# Patient Record
Sex: Female | Born: 1945 | Race: White | Marital: Married | Smoking: Never smoker
Health system: Southern US, Community
[De-identification: ages and names within clinical notes are randomized; demographics above are authoritative.]

## PROBLEM LIST (undated history)

## (undated) DIAGNOSIS — K219 Gastro-esophageal reflux disease without esophagitis: Secondary | ICD-10-CM

## (undated) DIAGNOSIS — I10 Essential (primary) hypertension: Secondary | ICD-10-CM

## (undated) DIAGNOSIS — C50919 Malignant neoplasm of unspecified site of unspecified female breast: Secondary | ICD-10-CM

## (undated) DIAGNOSIS — F32A Depression, unspecified: Secondary | ICD-10-CM

## (undated) DIAGNOSIS — K449 Diaphragmatic hernia without obstruction or gangrene: Secondary | ICD-10-CM

## (undated) DIAGNOSIS — G4730 Sleep apnea, unspecified: Secondary | ICD-10-CM

## (undated) DIAGNOSIS — H539 Unspecified visual disturbance: Secondary | ICD-10-CM

## (undated) HISTORY — DX: Gastro-esophageal reflux disease without esophagitis: K21.9

## (undated) HISTORY — DX: Diaphragmatic hernia without obstruction or gangrene: K44.9

## (undated) HISTORY — DX: Unspecified visual disturbance: H53.9

## (undated) HISTORY — DX: Essential (primary) hypertension: I10

## (undated) HISTORY — DX: Malignant neoplasm of unspecified site of unspecified female breast: C50.919

## (undated) HISTORY — DX: Sleep apnea, unspecified: G47.30

## (undated) HISTORY — DX: Depression, unspecified: F32.A

---

## 2008-05-18 HISTORY — PX: MASTECTOMY: SHX3

## 2009-02-08 ENCOUNTER — Ambulatory Visit
Admission: RE | Admit: 2009-02-08 | Disposition: A | Discharge status: Home / Self Care | Payer: Self-pay | Source: Ambulatory Visit | Admitting: Surgery

## 2011-03-06 NOTE — Op Note (Unsigned)
Account Number: [id number]      Document ID: [id number]      Admit Date: 02/08/2009      Procedure Date: 02/08/2009            Patient Location: DISCHARGED 02/09/2009      Patient Type: A            SURGEON: Verneda Skill MD      ASSISTANT:                  PREOPERATIVE DIAGNOSES:      1.  Recurrent right DCIS.      2.  History of right lumpectomy followed by the radiation treatment for      DCIS in 2002.            POSTOPERATIVE DIAGNOSES:      1.  Recurrent right DCIS.      2.  History of right lumpectomy followed by the radiation treatment for      DCIS in 2002.            TITLE OF PROCEDURE:      1.  Attempted right sentinel lymph node biopsy with no yield.      2.  Right completion mastectomy.            ANESTHESIA:      General with LMA.            INDICATIONS:      This is a 65 year old female who previously underwent breast conservation      treatment for right DCIS in 2002, developed a recurrent DCIS on the      percutaneous biopsy.  Given the history of previous radiation, the      mastectomy was recommended.  Reconstructive option was also discussed with      the patient, however, patient declined at this time.  Given the medium      high-grade nature of the percutaneously biopsied, specimen, the sentinel      lymph node biopsy was also recommended and discussed and informed consent      was obtained prior to the procedure.            DESCRIPTION OF PROCEDURE:      The patient was placed on the operating room table in supine position and      after adequate general anesthesia was induced, 1 mL of isosulfan blue dye      was injected in subareolar complex on the right breast and lightly agitated      for approximately 5 minutes.  Then the right breast and anterior chest wall      was prepped and draped in usual sterile fashion.  Then, elliptical incision      to include the right nipple-areolar complex of the right breast was made      and subcutaneous tissue was divided and a flap was elevated.  This       elevation was carried out to the level just below the clavicle superiorly      and sternum medially and insertion rectus inferiorly and the latissimus      dorsi muscle laterally.  Of note, because of the previous radiation,      fibrotic changes were also noted.  After this was done, the breast tissue      was removed from the pectoralis fascia while the perforators were      cauterized for hemostasis.  The removed specimen was oriented with a suture  at the lateral aspect and sent to pathology.            Then, attention was sent to the right axilla where clavipectoral fascia was      incised and the right axillary fossa was explored.  However, there are no      blue-stained lymph node noted.  The level 1 and level 2 lymph nodes were      also explored; however, there were no suspicious or enlarged lymph nodes      noted.  Because of the radiation changes, this dissection was somewhat      technically difficult.  Given the pathology of DCIS, axillary lymph node      sampling or node dissection was not done.            Adequate hemostasis was ascertained and a 15 round Blake drain was brought      through a separate stab wound and placed under the mastectomy flap.  Then      the incision was closed with absorbable suture in multiple layers.  The      patient tolerated the procedure well and was transferred to the recovery      room in satisfactory condition.  Estimated blood loss was minimum and the      counts were correct at the end of the procedure.                              _______________________________     Date/Time Signed: _____________      Verneda Skill MD [phone])            D:  02/08/2009 17:42 PM by Dr. Verneda Skill, MD [phone])      T:  02/09/2009 15:08 PM by [id number]          Everlean Cherry: [id number]) (Doc ID: [id number])                  UX:[id number] Gendel MD      Verneda Skill MD

## 2013-04-06 NOTE — H&P (Addendum)
pnt seen and examined and agree with above  Plan tlh/bso poss lnd poss xlap

## 2013-04-08 ENCOUNTER — Telehealth: Payer: Medicare Other

## 2013-04-08 NOTE — Pre-Procedure Instructions (Signed)
Unable to reach pt or son for int, left a vm for them to call back [phone] & put the case on the db table.

## 2013-04-10 NOTE — Discharge Instructions (Signed)
[facility]   [address], [address]   [place], [place] [address]   Phone: [phone]   Fax: [phone]        Discharge Instructions for Gynecologic Surgery  You had gynecologic surgery. This sheet contains information about what you can and can't do after your surgery. Remember, you need to take it easy.    Activity   Limit your activity for4-6weeks.   Don't lift anything heavier than5-10pounds (nothing heavier than a full milk jug).   Avoid strenuous activities, such as mowing the lawn, vacuuming, lifting laundry basket, or playing sports.   Limit your activity to short, slow walks. Gradually increase your pace and distance as you feel able.   Listen to your body. If an activity causes pain, stop.   Don't drive for2weeks. You may ride in a car for short trips.   Rest when you are tired.   Don't have sexual intercourse or use tampons or douches until your doctor says it's safe to do so.   No soaking in bathtub or swimming until cleared by your doctor    Home Care   Always keep your incision clean and dry.   Shower as needed. Wash your incision gently with mild soap and warm water and pat dry. Do not put lotions or ointments on these areas.   Use pain medication as prescribed   Use tylenol as needed    Check your temperature every day for1week(s) after your surgery.   Return to your diet as you feel able. Eat a healthy, well-balanced diet.    It is normal to have vaginal bleeding after this surgery (like a light period or spotting) for up to two weeks. You may then have bleeding again at 6 weeks when the sutures dissolve. You can wear a panty liner during this period (unscented). As the sutures dissolve, it is normal to have a watery yellow discharge with a musty odor. It is NOT normal to soak a pad an hour with blood, or to have a very foul smelling discharge. If you experience this, you need to be evaluated immediately.    Do not drink alcohol while  taking narcotics.    You may put an ice pack on your perineal area three times a day (wrapped in a towel, not directly on skin) the first week after surgery to help alleviate pain and swelling.     Avoid constipation.   Use laxatives, stool softeners, or enemas as directed by your doctor. (recommend colace three times per day)   Eat more high-fiber foods.   Drink6-8 glasses of water every day, unless directed otherwise.    Follow-Up  Make a follow-up appointment as directed by our staff.    When to Call Your Doctor  Call your doctor right away if you have any of the following:   Fever above101.5For chills   Bright red vaginal bleeding or a smelly discharge   Vaginal bleeding that soaks more than onesanitary pad per hour   Trouble urinating or burning sensationwhen you urinate   Severe abdominal pain or bloating   Redness, swelling, or drainage at your incision site   Shortness of breath   Vomiting   Severe constipation      2000-2011 [employer], [address], [place] [address]. All rights reserved. This information is not intended as a substitute for professional medical care. Always follow your healthcare professional's instructions.

## 2013-04-11 ENCOUNTER — Ambulatory Visit: Payer: Medicare Other | Admitting: Gynecologic Oncology

## 2013-04-11 ENCOUNTER — Ambulatory Visit: Payer: Self-pay

## 2013-04-11 ENCOUNTER — Encounter (HOSPITAL_BASED_OUTPATIENT_CLINIC_OR_DEPARTMENT_OTHER): Payer: Self-pay | Admitting: Certified Registered Nurse Anesthetist

## 2013-04-11 ENCOUNTER — Encounter
Admission: RE | Disposition: A | Discharge status: Home / Self Care | Payer: Self-pay | Source: Ambulatory Visit | Attending: Gynecologic Oncology

## 2013-04-11 ENCOUNTER — Ambulatory Visit (HOSPITAL_BASED_OUTPATIENT_CLINIC_OR_DEPARTMENT_OTHER): Payer: Medicare Other | Admitting: Certified Registered Nurse Anesthetist

## 2013-04-11 ENCOUNTER — Encounter (HOSPITAL_BASED_OUTPATIENT_CLINIC_OR_DEPARTMENT_OTHER): Payer: Self-pay

## 2013-04-11 ENCOUNTER — Ambulatory Visit
Admission: RE | Admit: 2013-04-11 | Discharge: 2013-04-12 | Disposition: A | Discharge status: Home / Self Care | Payer: Medicare Other | Source: Ambulatory Visit | Attending: Gynecologic Oncology | Admitting: Gynecologic Oncology

## 2013-04-11 DIAGNOSIS — D250 Submucous leiomyoma of uterus: Secondary | ICD-10-CM | POA: Insufficient documentation

## 2013-04-11 DIAGNOSIS — Z853 Personal history of malignant neoplasm of breast: Secondary | ICD-10-CM | POA: Insufficient documentation

## 2013-04-11 DIAGNOSIS — K219 Gastro-esophageal reflux disease without esophagitis: Secondary | ICD-10-CM | POA: Insufficient documentation

## 2013-04-11 DIAGNOSIS — C549 Malignant neoplasm of corpus uteri, unspecified: Secondary | ICD-10-CM | POA: Insufficient documentation

## 2013-04-11 DIAGNOSIS — N841 Polyp of cervix uteri: Secondary | ICD-10-CM | POA: Insufficient documentation

## 2013-04-11 DIAGNOSIS — E785 Hyperlipidemia, unspecified: Secondary | ICD-10-CM | POA: Insufficient documentation

## 2013-04-11 DIAGNOSIS — F3289 Other specified depressive episodes: Secondary | ICD-10-CM | POA: Insufficient documentation

## 2013-04-11 DIAGNOSIS — I10 Essential (primary) hypertension: Secondary | ICD-10-CM | POA: Insufficient documentation

## 2013-04-11 DIAGNOSIS — G4730 Sleep apnea, unspecified: Secondary | ICD-10-CM | POA: Insufficient documentation

## 2013-04-11 DIAGNOSIS — Z9010 Acquired absence of unspecified breast and nipple: Secondary | ICD-10-CM | POA: Insufficient documentation

## 2013-04-11 DIAGNOSIS — K449 Diaphragmatic hernia without obstruction or gangrene: Secondary | ICD-10-CM | POA: Insufficient documentation

## 2013-04-11 HISTORY — PX: LAPAROSCOPIC, HYSTERECTOMY, TOTAL, BSO: SHX4523

## 2013-04-11 SURGERY — LAPAROSCOPIC, HYSTERECTOMY, TOTAL, BSO
Anesthesia: Anesthesia General | Site: Abdomen | Wound class: Clean Contaminated

## 2013-04-11 MED ORDER — GLYCOPYRROLATE 0.2 MG/ML IJ SOLN
INTRAMUSCULAR | Status: AC
Start: 2013-04-11 — End: ?
  Filled 2013-04-11: qty 3

## 2013-04-11 MED ORDER — LACTATED RINGERS IV SOLN
INTRAVENOUS | Status: DC
Start: 2013-04-11 — End: 2013-04-12

## 2013-04-11 MED ORDER — BUPIVACAINE HCL (PF) 0.25 % IJ SOLN
INTRAMUSCULAR | Status: AC
Start: 2013-04-11 — End: 2013-04-12
  Filled 2013-04-11: qty 10

## 2013-04-11 MED ORDER — PROPOFOL 10 MG/ML IV EMUL
INTRAVENOUS | Status: AC
Start: 2013-04-11 — End: ?
  Filled 2013-04-11: qty 20

## 2013-04-11 MED ORDER — HYDROMORPHONE HCL 2 MG PO TABS
2.0000 mg | ORAL_TABLET | ORAL | Status: DC | PRN
Start: 2013-04-11 — End: 2017-09-02

## 2013-04-11 MED ORDER — PROPOFOL INFUSION 10 MG/ML
INTRAVENOUS | Status: DC | PRN
Start: 2013-04-11 — End: 2013-04-11
  Administered 2013-04-11: 200 mg via INTRAVENOUS

## 2013-04-11 MED ORDER — DEXAMETHASONE SODIUM PHOSPHATE 20 MG/5ML IJ SOLN
INTRAMUSCULAR | Status: AC
Start: 2013-04-11 — End: ?
  Filled 2013-04-11: qty 5

## 2013-04-11 MED ORDER — LABETALOL HCL 5 MG/ML IV SOLN
INTRAVENOUS | Status: DC | PRN
Start: 2013-04-11 — End: 2013-04-11
  Administered 2013-04-11: 10 mg via INTRAVENOUS
  Administered 2013-04-11: 5 mg via INTRAVENOUS
  Administered 2013-04-11: 10 mg via INTRAVENOUS

## 2013-04-11 MED ORDER — HYDROMORPHONE HCL PF 1 MG/ML IJ SOLN
INTRAMUSCULAR | Status: AC
Start: 2013-04-11 — End: ?
  Filled 2013-04-11: qty 1

## 2013-04-11 MED ORDER — GLYCOPYRROLATE 0.2 MG/ML IJ SOLN
INTRAMUSCULAR | Status: DC | PRN
Start: 2013-04-11 — End: 2013-04-11
  Administered 2013-04-11: .5 mg via INTRAVENOUS

## 2013-04-11 MED ORDER — FENTANYL CITRATE 0.05 MG/ML IJ SOLN
INTRAMUSCULAR | Status: AC
Start: 2013-04-11 — End: ?
  Filled 2013-04-11: qty 2

## 2013-04-11 MED ORDER — BELLADONNA ALKALOIDS-OPIUM 16.2-60 MG RE SUPP
RECTAL | Status: AC
Start: 2013-04-11 — End: ?
  Filled 2013-04-11: qty 1

## 2013-04-11 MED ORDER — BUPIVACAINE-EPINEPHRINE (PF) 0.25% -1:200000 IJ SOLN
INTRAMUSCULAR | Status: DC | PRN
Start: 2013-04-11 — End: 2013-04-11
  Administered 2013-04-11: 10 mL via INTRAMUSCULAR

## 2013-04-11 MED ORDER — ENOXAPARIN SODIUM 40 MG/0.4ML SC SOLN
40.00 mg | Freq: Every day | SUBCUTANEOUS | Status: DC
Start: 2013-04-11 — End: 2013-04-12
  Administered 2013-04-12: 40 mg via SUBCUTANEOUS
  Filled 2013-04-11: qty 0.4

## 2013-04-11 MED ORDER — LIDOCAINE HCL (PF) 2 % IJ SOLN
INTRAMUSCULAR | Status: AC
Start: 2013-04-11 — End: ?
  Filled 2013-04-11: qty 5

## 2013-04-11 MED ORDER — LABETALOL HCL 5 MG/ML IV SOLN
INTRAVENOUS | Status: AC
Start: 2013-04-11 — End: ?
  Filled 2013-04-11: qty 20

## 2013-04-11 MED ORDER — HYDROMORPHONE HCL PF 1 MG/ML IJ SOLN
0.5000 mg | INTRAMUSCULAR | Status: DC | PRN
Start: 2013-04-11 — End: 2013-04-11
  Administered 2013-04-11: 0.5 mg via INTRAVENOUS

## 2013-04-11 MED ORDER — MEPERIDINE HCL 25 MG/ML IJ SOLN
12.5000 mg | INTRAMUSCULAR | Status: DC | PRN
Start: 2013-04-11 — End: 2013-04-11

## 2013-04-11 MED ORDER — ROCURONIUM BROMIDE 50 MG/5ML IV SOLN
INTRAVENOUS | Status: AC
Start: 2013-04-11 — End: ?
  Filled 2013-04-11: qty 5

## 2013-04-11 MED ORDER — FENTANYL CITRATE 0.05 MG/ML IJ SOLN
50.0000 ug | INTRAMUSCULAR | Status: AC | PRN
Start: 2013-04-11 — End: 2013-04-11
  Administered 2013-04-11 (×2): 50 ug via INTRAVENOUS

## 2013-04-11 MED ORDER — FAMOTIDINE 20 MG/2ML IV SOLN
INTRAVENOUS | Status: AC
Start: 2013-04-11 — End: ?
  Filled 2013-04-11: qty 2

## 2013-04-11 MED ORDER — ONDANSETRON HCL 4 MG/2ML IJ SOLN
4.0000 mg | Freq: Every day | INTRAMUSCULAR | Status: DC | PRN
Start: 2013-04-11 — End: 2013-04-12

## 2013-04-11 MED ORDER — MIDAZOLAM HCL 2 MG/2ML IJ SOLN
INTRAMUSCULAR | Status: DC | PRN
Start: 2013-04-11 — End: 2013-04-11
  Administered 2013-04-11 (×2): 1 mg via INTRAVENOUS

## 2013-04-11 MED ORDER — BELLADONNA ALKALOIDS-OPIUM 16.2-60 MG RE SUPP
60.0000 mg | Freq: Once | RECTAL | Status: AC
Start: 2013-04-11 — End: 2013-04-11
  Administered 2013-04-11: 60 mg via RECTAL

## 2013-04-11 MED ORDER — METHYLENE BLUE 1 % IJ SOLN
INTRAMUSCULAR | Status: DC | PRN
Start: 2013-04-11 — End: 2013-04-11
  Administered 2013-04-11: 10 mg via INTRAVENOUS

## 2013-04-11 MED ORDER — FAMOTIDINE 10 MG/ML IV SOLN (WRAP)
INTRAVENOUS | Status: DC | PRN
Start: 2013-04-11 — End: 2013-04-11
  Administered 2013-04-11: 20 mg via INTRAVENOUS

## 2013-04-11 MED ORDER — NICARDIPINE IV BOLUS SYRINGE (ANESTHESIA)
INTRAVENOUS | Status: AC
Start: 2013-04-11 — End: ?
  Filled 2013-04-11: qty 5

## 2013-04-11 MED ORDER — HYDROMORPHONE HCL 2 MG PO TABS
2.0000 mg | ORAL_TABLET | ORAL | Status: DC | PRN
Start: 2013-04-11 — End: 2013-04-12
  Administered 2013-04-12: 2 mg via ORAL
  Filled 2013-04-11: qty 1

## 2013-04-11 MED ORDER — LACTATED RINGERS IV SOLN
150.0000 mL/h | INTRAVENOUS | Status: DC
Start: 2013-04-11 — End: 2013-04-12
  Administered 2013-04-11: 150 mL/h via INTRAVENOUS

## 2013-04-11 MED ORDER — ACETAMINOPHEN 500 MG PO TABS
1000.0000 mg | ORAL_TABLET | Freq: Three times a day (TID) | ORAL | Status: DC
Start: 2013-04-11 — End: 2013-04-12
  Administered 2013-04-11 – 2013-04-12 (×2): 1000 mg via ORAL
  Filled 2013-04-11 (×2): qty 2

## 2013-04-11 MED ORDER — BUPIVACAINE-EPINEPHRINE (PF) 0.25% -1:200000 IJ SOLN
INTRAMUSCULAR | Status: AC
Start: 2013-04-11 — End: 2013-04-12
  Filled 2013-04-11: qty 20

## 2013-04-11 MED ORDER — LACTATED RINGERS IV SOLN
INTRAVENOUS | Status: DC | PRN
Start: 2013-04-11 — End: 2013-04-11

## 2013-04-11 MED ORDER — NICARDIPINE IV BOLUS SYRINGE (ANESTHESIA)
INTRAVENOUS | Status: DC | PRN
Start: 2013-04-11 — End: 2013-04-11
  Administered 2013-04-11: .2 mg via INTRAVENOUS

## 2013-04-11 MED ORDER — LIDOCAINE HCL 2 % IJ SOLN
INTRAMUSCULAR | Status: DC | PRN
Start: 2013-04-11 — End: 2013-04-11
  Administered 2013-04-11: 100 mg

## 2013-04-11 MED ORDER — FENTANYL CITRATE 0.05 MG/ML IJ SOLN
INTRAMUSCULAR | Status: DC | PRN
Start: 2013-04-11 — End: 2013-04-11
  Administered 2013-04-11 (×6): 50 ug via INTRAVENOUS

## 2013-04-11 MED ORDER — MIDAZOLAM HCL 2 MG/2ML IJ SOLN
INTRAMUSCULAR | Status: AC
Start: 2013-04-11 — End: ?
  Filled 2013-04-11: qty 2

## 2013-04-11 MED ORDER — SODIUM CHLORIDE 0.9 % IR SOLN
Status: DC | PRN
Start: 2013-04-11 — End: 2013-04-11
  Administered 2013-04-11: 200 mL

## 2013-04-11 MED ORDER — STERILE WATER FOR IRRIGATION IR SOLN
Status: DC | PRN
Start: 2013-04-11 — End: 2013-04-11
  Administered 2013-04-11: 200 mL

## 2013-04-11 MED ORDER — CEFAZOLIN SODIUM-DEXTROSE 2-3 GM-% IV SOLR
INTRAVENOUS | Status: AC
Start: 2013-04-11 — End: 2013-04-12
  Filled 2013-04-11: qty 50

## 2013-04-11 MED ORDER — GLYCOPYRROLATE 0.2 MG/ML IJ SOLN
INTRAMUSCULAR | Status: AC
Start: 2013-04-11 — End: ?
  Filled 2013-04-11: qty 2

## 2013-04-11 MED ORDER — ONDANSETRON HCL 4 MG/2ML IJ SOLN
INTRAMUSCULAR | Status: DC | PRN
Start: 2013-04-11 — End: 2013-04-11
  Administered 2013-04-11: 4 mg via INTRAVENOUS

## 2013-04-11 MED ORDER — HYDROCODONE-ACETAMINOPHEN 5-325 MG PO TABS
1.0000 | ORAL_TABLET | ORAL | Status: DC | PRN
Start: 2013-04-11 — End: 2013-04-11

## 2013-04-11 MED ORDER — NEOSTIGMINE METHYLSULFATE 1 MG/ML IJ SOLN
INTRAMUSCULAR | Status: AC
Start: 2013-04-11 — End: ?
  Filled 2013-04-11: qty 10

## 2013-04-11 MED ORDER — CEFAZOLIN SODIUM-DEXTROSE 2-3 GM-% IV SOLR
2.00 g | INTRAVENOUS | Status: DC
Start: 2013-04-11 — End: 2013-04-12
  Administered 2013-04-11: 2 g via INTRAVENOUS

## 2013-04-11 MED ORDER — DOCUSATE SODIUM 100 MG PO CAPS
100.0000 mg | ORAL_CAPSULE | Freq: Two times a day (BID) | ORAL | Status: AC
Start: 2013-04-11 — End: ?

## 2013-04-11 MED ORDER — PROMETHAZINE HCL 25 MG/ML IJ SOLN
6.2500 mg | Freq: Once | INTRAMUSCULAR | Status: DC | PRN
Start: 2013-04-11 — End: 2013-04-11

## 2013-04-11 MED ORDER — ENOXAPARIN SODIUM 40 MG/0.4ML SC SOLN
SUBCUTANEOUS | Status: AC
Start: 2013-04-11 — End: 2013-04-11
  Administered 2013-04-11: 40 mg via SUBCUTANEOUS
  Filled 2013-04-11: qty 0.4

## 2013-04-11 MED ORDER — ONDANSETRON HCL 4 MG/2ML IJ SOLN
INTRAMUSCULAR | Status: AC
Start: 2013-04-11 — End: ?
  Filled 2013-04-11: qty 2

## 2013-04-11 MED ORDER — NEOSTIGMINE METHYLSULFATE 1 MG/ML IJ SOLN
INTRAMUSCULAR | Status: DC | PRN
Start: 2013-04-11 — End: 2013-04-11
  Administered 2013-04-11: 3 mg via INTRAVENOUS

## 2013-04-11 MED ORDER — PROMETHAZINE HCL 25 MG/ML IJ SOLN
12.5000 mg | INTRAMUSCULAR | Status: DC | PRN
Start: 2013-04-11 — End: 2013-04-12

## 2013-04-11 MED ORDER — ROCURONIUM BROMIDE 50 MG/5ML IV SOLN
INTRAVENOUS | Status: DC | PRN
Start: 2013-04-11 — End: 2013-04-11
  Administered 2013-04-11: 40 mg via INTRAVENOUS

## 2013-04-11 MED ORDER — ONDANSETRON HCL 4 MG/2ML IJ SOLN
4.0000 mg | Freq: Once | INTRAMUSCULAR | Status: DC | PRN
Start: 2013-04-11 — End: 2013-04-11

## 2013-04-11 MED ORDER — DEXAMETHASONE SODIUM PHOSPHATE 4 MG/ML IJ SOLN (WRAP)
INTRAMUSCULAR | Status: DC | PRN
Start: 2013-04-11 — End: 2013-04-11
  Administered 2013-04-11: 8 mg via INTRAVENOUS

## 2013-04-11 SURGICAL SUPPLY — 67 items
APPLCATOR CHLORAPREP 26ML (Prep) ×2 IMPLANT
APPLICATOR ENDOSCOPIC L41 CM (Hemostat) ×1 IMPLANT
APPLICATOR ENDOSCOPIC L41 CM NONREFLECTIVE CANNULA CANNULATED STYLET (Hemostat) ×1 IMPLANT
APPLICATOR ESCP SS FLSL 5MM 41CM LF STRL (Hemostat) ×2
APPLIER IN CLP TI MED LG LIGAMAX 5MM (Endoscopic Supplies)
APPLIER INTERNAL CLIP MEDIUM LARGE (Endoscopic Supplies) IMPLANT
APPLIER INTERNAL CLIP MEDIUM LARGE TITANIUM ANGLE JAW DISTAL TIP CLOSE (Endoscopic Supplies) IMPLANT
BAG FOLEY DRAIN 16FR (Catheter Micellaneous) ×1
BAG FOLEY DRAIN 16FR (Catheter Miscellaneous) ×1 IMPLANT
BAND AID STERILE 1X3 (Dressing) ×4 IMPLANT
BANDAGE BANDAID SPOT 7/8IN (Ortho Supply) ×4 IMPLANT
BLADE SRGCLPR LF STRL PVT ADJ HD DISP (Blade)
BLADE SURGICAL CLIPPER PIVOT ADJUSTABLE (Blade) IMPLANT
BLADE SURGICAL CLIPPER PIVOT ADJUSTABLE HEAD 9661 PURPLE (Blade) IMPLANT
CANNISTER SUCTION 3000CC (Suction) ×2 IMPLANT
CANULA STABILITY 5MM (Procedure Accessories) ×4 IMPLANT
CATHETER SURGICAL OD6 FR COLPO-PNEUMO (Procedure Accessories) ×1 IMPLANT
COVER HVYDTY BLK 65X90IN (Drape) ×2 IMPLANT
GLOVE SURG BIOGEL ORTHO SZ8 (Glove) ×6 IMPLANT
HOLDER CATH VLCR UNV CATH-MATE LF ADJ (Procedure Accessories) ×2
HOLDER CATHETER UNIVERSAL ADJUSTABLE (Procedure Accessories) ×1 IMPLANT
INACTIVE USE LAWSON 120900 (Gown) IMPLANT
INACTIVE USE LAWSON 93583 (Gown) ×2 IMPLANT
IRRIGATOR SUCTN PUMP/HANDPIECE (Suction) ×2 IMPLANT
KIT CLOSURE PROCEDURE (Suture) ×4 IMPLANT
KIT HEMOSTATIC MALLEABLE APPLICATOR FLOSEAL 13CM MATRIX 5ML (Hemostat) ×1 IMPLANT
KIT HMST MTRX 5ML 13CM FLSL MLBL APL (Hemostat) ×2 IMPLANT
MANIPULATOR UTERINE OD6 FR COLPO-PNEUMO OCCLUDER SILICONE (Procedure Accessories) ×1 IMPLANT
NEEDLE INJ SFTY 22GX1.5IN (Needles) ×2 IMPLANT
NEEDLE REG BEVEL 19GX1.5IN (Needles) ×2 IMPLANT
OCCLUDER COLPO PNEUMO (Procedure Accessories) ×2
PACK TLH ONCOLOGY (Pack) ×2 IMPLANT
PAD ELECTROSRG GRND REM W CRD (Procedure Accessories) ×2 IMPLANT
PAD SANITARY L12.25 IN X W4.25 IN HEAVY ABSORBENT MOISTURE BARRIER (Dressing) ×1 IMPLANT
PAD SNTR SLK FLF CRTY 12.25X4.25IN LF NS (Dressing) ×2 IMPLANT
POUCH INSTRUMENT (Drape) ×2 IMPLANT
SCISSOR ENDOCUT DISP LAPO (Instrument) ×2 IMPLANT
SEALER LAPARSCOPIC DIVIDER 5MM (Laparoscopy Supplies) IMPLANT
SET IRR DEHP 10 GTT/ML STRG 81IN LF STRL (Tubing) ×2
SET IRRIGATION L81 IN 10 GTT/ML STRAIGHT (Tubing) ×1 IMPLANT
SET IRRIGATION L81 IN 10 GTT/ML STRAIGHT NA DEHP BLADDER REGULATE (Tubing) ×1 IMPLANT
SHEAR CURVD ERGO HNDLE 36CM (Cautery) ×2 IMPLANT
SLIPCOVER LAP-HUG-U-VAC LAP-S (Sterilization Supply) ×2 IMPLANT
SMOKE EVAC ULTRA PLUMEAWAY 6.0 (Tubing) ×2 IMPLANT
STRIP SKIN CLOSURE L4 IN X W1/2 IN (Dressing) ×1 IMPLANT
STRIP SKIN CLOSURE L4 IN X W1/2 IN REINFORCE STERI-STRIP POLYESTER (Dressing) ×1 IMPLANT
STRIP SKNCLS PLSTR STRSTRP 4X.5IN LF (Dressing) ×2
SUTURE MONOCRYL 3-0 PS2 27IN (Suture) ×4 IMPLANT
SUTURE VICRYL 0 CT1 8X27IN (Suture) ×2 IMPLANT
SUTURE VICRYL 0 UR6 27IN (Suture) IMPLANT
SUTURE VICRYL 0-0 CT1 (Suture) ×2 IMPLANT
SUTURE VICRYL 012X18IN (Suture) ×2 IMPLANT
SYRINGE LEUR LOK TIP 30 ML (Syringes, Needles) ×2 IMPLANT
SYSTEM IMAGING 8X6IN CLEARIFY MICROFIBER WARM HUB TRCR WIPE DSPSBL (Kits) ×1 IMPLANT
SYSTEM IMG MRFBR CLEARIFY 8X6IN WRM HUB (Kits) ×2 IMPLANT
TIP MANIPULATOR RUMI II OD6.7 MM (Procedure Accessories) ×1 IMPLANT
TIP MANIPULATOR RUMI II OD6.7 MM FLEXIBLE UTERINE L8 CM BLUE (Procedure Accessories) ×1 IMPLANT
TIP MNPLT RUMI II 6.7MM 8CM LF STRL FLXB (Procedure Accessories) ×1
TIP RUMI 6.7MM X 8CM (Procedure Accessories) ×1
TOWEL STERILE REUSABLE 8PK (Procedure Accessories) ×2 IMPLANT
TRAY WOMEN S MINI FFX (Tray) IMPLANT
TROCAR BLADELESS ENDO 5X100MM (Laparoscopy Supplies) ×2 IMPLANT
TROCAR ENDO BLADELESS 11X100MM (Laparoscopy Supplies) ×2 IMPLANT
TUBE SET DISP HIGH FLOW (Tubing) ×1
TUBING INSFL THRMPLST 45L PNEUMOSURE (Tubing) ×1
TUBING INSUFFLATION SET HIGH FLOW (Tubing) ×1 IMPLANT
TUBING INSUFFLATION SET HIGH FLOW TOUCHSCREEN PNEUMOSURE THERMOPLASTIC (Tubing) ×1 IMPLANT

## 2013-04-11 NOTE — OR Nursing (Signed)
Floseal 5ml lot [id number] exp 03-2014

## 2013-04-11 NOTE — Transfer of Care (Signed)
Anesthesia Transfer of Care Note    Patient: Valerie Burch    Procedures performed: Procedure(s) with comments:  LAPAROSCOPIC, HYSTERECTOMY, TOTAL, BSO - with bilateral pelvic lymph node dissection, cystoscopy    Anesthesia type: General ETT    Patient location:Phase I PACU    Last vitals:   Filed Vitals:    04/11/13 1639   BP: 166/81   Pulse: 75   Temp:    Resp: 14   SpO2: 96%       Post pain: Patient not complaining of pain, continue current therapy      Mental Status:drowsy    Respiratory Function: tolerating face mask    Cardiovascular: stable    Nausea/Vomiting: patient not complaining of nausea or vomiting    Hydration Status: adequate    Post assessment: no apparent anesthetic complications, a/w unobstructed, report given and acknowledged

## 2013-04-11 NOTE — Plan of Care (Signed)
Problem: Moderate/High Fall Risk Score >/=15  Goal: Patient will remain free of falls  Encourage patient to call for assistance before getting out of bed  Assess patient for dizziness, SOB, pulse ox, every hour and especially after pain medication is given    Patient remains in bed with son at bedside. Call light is within reach

## 2013-04-11 NOTE — Brief Op Note (Signed)
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]    Postoperative Diagnosis:   Malignant neoplasm of corpus uteri, 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]same[    Anesthesia:   General    Estimated Blood Loss:   50mL    Drains:   No    Specimens:        SPECIMENS (last 24 hours)      Pathology Specimens     Row Name 04/11/13 1500 04/11/13 1600          Additional Information    Send final report to: [facility] --     Specimen Information    Specimen Testing Required Routine Pathology Frozen Section     Specimen ID  A A     Specimen Description Uterus, cervix, bilateral tubes and ovaries PC     Specimen Information    Specimen Testing Required Routine Pathology --     Specimen ID  B --     Specimen Description Right pelvic lymph nodes --     Specimen Information    Specimen Testing Required Routine Pathology --     Specimen ID  C --     Specimen Description left pelvic lymph nodes --         Findings:   Sound to 8cm. bilat tubes and ovaries grossly normal, uterus grossly normal. Frozen section showing polypoid lesion in endometrium but no invasion    Complications:   none      Signed by: Lou Cal, MD                                                                           [facility] OR

## 2013-04-11 NOTE — Plan of Care (Signed)
Problem: Pain interferes with ability to perform ADL  Goal: Pain at adequate level as identified by patient  Outcome: Progressing  Assess patient for pain level every hour  Offer pain medication as needed  Patient has tylenol 1000 mg scheduled at 2200 but refused . Denies pain

## 2013-04-11 NOTE — Anesthesia Preprocedure Evaluation (Signed)
Anesthesia Evaluation    AIRWAY    Mallampati: II    TM distance: >3 FB  Neck ROM: full  Mouth Opening:full   CARDIOVASCULAR           DENTAL         PULMONARY         OTHER FINDINGS    Inpatient Anesthesia Evaluation    Patient Name: ZOXWRUEAV,WUJW B  Surgeon: Zackery Barefoot, MD  Patient Age / Sex: 67 y.o. / female    Medical History:  Past Medical History:    Hypertension                                                  Sleep apnea                                                   Gastroesophageal reflux disease                               Hiatal hernia                                                 Malignant neoplasm of breast                                    Comment:post surgery right    Abnormal vision                                                 Comment:reading glasses    Depression                                                    Past Surgical History:    MASTECTOMY                                      2010            Allergies:  No Known Allergies      Medications:  Current Facility-Administered Medications:  dexamethasone (DECADRON) injection, , , PRN, 8 mg at 04/11/13 1506  famotidine (PEPCID) injection, , , PRN, 20 mg at 04/11/13 1434  fentaNYL (SUBLIMAZE) injection, , , PRN, 50 mcg at 04/11/13 1507  lactated ringers infusion, , , Continuous PRN  lidocaine (XYLOCAINE) 2 % injection, , , PRN, 100 mg at 04/11/13 1437  midazolam (VERSED) injection, , , PRN, 1 mg at 04/11/13 1434  propofol (DIPRIVAN) 10 mg/mL infusion, , , PRN, 200 mg at 04/11/13 1437  rocuronium (ZEMURON) injection, , , PRN, 40 mg at  04/11/13 1438    Facility-Administered Medications Ordered in Other Visits:  bupivacaine-EPINEPHrine (PF) (SENSORCAINE/EPINEPHRINE) 0.25 % injection, , ,   ceFAZolin (ANCEF) 2 g in dextrose 5% water 50 mL (duplex) premix, 2 g, Intravenous, 30 Min Pre-Op, 2 g at 04/11/13 1432  ceFAZolin (ANCEF) 2-3 GM-% 2 g in dextrose 5% water 50 mL (duplex) premix, , ,   enoxaparin (LOVENOX) syringe 40 mg, 40 mg,  Subcutaneous, Daily, 40 mg at 04/11/13 1427              Prior to Admission medications :  Medication HYDROCHLOROTHIAZIDE PO, Sig Take by mouth., Start Date , End Date , Taking? , Authorizing Provider [provider]    Medication lisinopril (PRINIVIL,ZESTRIL) 10 MG tablet, Sig Take 10 mg by mouth daily., Start Date , End Date , Taking? , Barrister's clerk, Historical, MD    Medication metoprolol XL (TOPROL-XL) 50 MG 24 hr tablet, Sig Take 50 mg by mouth daily., Start Date , End Date , Taking? , Barrister's clerk, Historical, MD    Medication pravastatin (PRAVACHOL) 40 MG tablet, Sig Take 40 mg by mouth daily., Start Date , End Date , Taking? , Barrister's clerk, Historical, MD    Medication ranitidine (ZANTAC) 150 MG tablet, Sig Take 150 mg by mouth 2 (two) times daily., Start Date , End Date , Taking? , Barrister's clerk, Historical, MD      Vitals  Temp:  (98.5 F (36.9 C)) 98.5 F (36.9 C)  Heart Rate:  (77) 77   Resp Rate:  (14) 14   BP: (159)/(73) 159/73 mmHg    Wt Readings from Last 3 Encounters:  04/11/13 : 99.791 kg (220 lb)  04/11/13 : 99.791 kg (220 lb)  BMI (Estimated Body mass index is 34.45 kg/(m^2) as calculated from the following:    Height as of an earlier encounter on 04/11/13: 5\' 7" (1.702 m).    Weight as of an earlier encounter on 04/11/13: 220 lb(99.791 kg).)  Temp Readings from Last 3 Encounters:  04/11/13 : 98.5 F (36.9 C) Oral  04/11/13 : 98.5 F (36.9 C) Oral  BP Readings from Last 3 Encounters:  04/11/13 : 159/73  04/11/13 : 159/73  Pulse Readings from Last 3 Encounters:  04/11/13 : 77  04/11/13 : 77        Labs:  CBC:  No results found for this basename: WBC, HGB, HCT, PLT    Chemistries:  No results found for this basename: NA, K, CL, co2, BUN, CREAT, GLU, HGBA1C,  MG, CA,  ALT, AST    Coags:  No results found for this basename: PT, PTT, INR  _____________________      Signed by: Teryl Lucy  04/11/2013   3:15  PM                            Anesthesia Plan    ASA 3     general                                 informed consent obtained

## 2013-04-11 NOTE — Anesthesia Postprocedure Evaluation (Signed)
Anesthesia Post Evaluation    Patient: Valerie Burch    Procedures performed: Procedure(s) with comments:  LAPAROSCOPIC, HYSTERECTOMY, TOTAL, BSO - with bilateral pelvic lymph node dissection, cystoscopy    Anesthesia type: General ETT    Patient location:Phase I PACU    Last vitals:   Filed Vitals:    04/11/13 1639   BP: 166/81   Pulse: 75   Temp:    Resp: 14   SpO2: 96%       Post pain: Patient not complaining of pain, continue current therapy      Mental Status:awake    Respiratory Function: tolerating room air    Cardiovascular: stable    Nausea/Vomiting: patient not complaining of nausea or vomiting    Hydration Status: adequate    Post assessment: no apparent anesthetic complications

## 2013-04-12 ENCOUNTER — Encounter (HOSPITAL_BASED_OUTPATIENT_CLINIC_OR_DEPARTMENT_OTHER): Payer: Self-pay | Admitting: Gynecologic Oncology

## 2013-04-12 LAB — CBC AND DIFFERENTIAL
Basophils Absolute Automated: 0 (ref 0.00–0.20)
Basophils Automated: 0 %
Eosinophils Absolute Automated: 0 (ref 0.00–0.70)
Eosinophils Automated: 0 %
Hematocrit: 40.2 % (ref 37.0–47.0)
Hgb: 13.3 g/dL (ref 12.0–16.0)
Immature Granulocytes Absolute: 0.03
Immature Granulocytes: 0 %
Lymphocytes Absolute Automated: 1.02 (ref 0.50–4.40)
Lymphocytes Automated: 11 %
MCH: 31 pg (ref 28.0–32.0)
MCHC: 33.1 g/dL (ref 32.0–36.0)
MCV: 93.7 fL (ref 80.0–100.0)
MPV: 11.9 fL (ref 9.4–12.3)
Monocytes Absolute Automated: 0.69 (ref 0.00–1.20)
Monocytes: 7 %
Neutrophils Absolute: 7.78 (ref 1.80–8.10)
Neutrophils: 82 %
Nucleated RBC: 0 (ref 0–1)
Platelets: 167 (ref 140–400)
RBC: 4.29 (ref 4.20–5.40)
RDW: 12 % (ref 12–15)
WBC: 9.52 (ref 3.50–10.80)

## 2013-04-12 LAB — BASIC METABOLIC PANEL
BUN: 10 mg/dL (ref 7.0–19.0)
CO2: 24 mEq/L (ref 22–29)
Calcium: 8.9 mg/dL (ref 8.5–10.5)
Chloride: 108 mEq/L — ABNORMAL HIGH (ref 98–107)
Creatinine: 0.7 mg/dL (ref 0.6–1.0)
Glucose: 124 mg/dL — ABNORMAL HIGH (ref 70–100)
Potassium: 4.1 mEq/L (ref 3.5–5.1)
Sodium: 140 mEq/L (ref 136–145)

## 2013-04-12 LAB — MAGNESIUM: Magnesium: 1.9 mg/dL (ref 1.6–2.6)

## 2013-04-12 LAB — GFR: EGFR: >60

## 2013-04-12 LAB — PHOSPHORUS: Phosphorus: 3.1 mg/dL (ref 2.3–4.7)

## 2013-04-12 MED ORDER — ENOXAPARIN SODIUM 40 MG/0.4ML SC SOLN
40.0000 mg | Freq: Every day | SUBCUTANEOUS | Status: AC
Start: 2013-04-12 — End: 2013-04-26

## 2013-04-12 NOTE — Progress Notes (Deleted)
Pt A&O x4 and followed commands at 0800, took pills whole. Needed assist x2 to bedside commode with walker. Went to take 1000 meds in at 1030 and pt would not take pills whole and wasn't able to drink her soda. Pills were crushed and mixed with applesauce. Pt tolerated well. A&O x1 (to self). Pt keeps repeating "pop pop quiz quiz" and "the list." Seen by MD. Bed alarm in place. Floor mats beneath. Bed in low position. Call bell within reach. Will continue to monitor.

## 2013-04-12 NOTE — Final Progress Note (DC Note for stay less than 48 (Cosign Needed)
S: Pain controlled. Tolerated clears overnight without N/V. Foley was removed one hour ago and patient has not felt urge to void yet. Has not ambulated, but feels good sitting in bed and denies CP, SOB, lightheadedness. Has appetite and would like regular diet. Patient reports a h/o of severe OSA and says she had oxygen on over night for this reason. Denies any SOB or dyspnea.    O:   Temp:  [96.1 F (35.6 C)-98.6 F (37 C)] 96.1 F (35.6 C)  Heart Rate:  [75-99] 76   Resp Rate:  [14-16] 16   BP: (93-166)/(55-97) 93/55 mmHg  O2 sat's 95-100    Gen: NAD, A&O  CV: RRR  Pulm: CTAB  Ab: soft, NT, ND, obese. Bandages covering laparoscopic incisions and appear C/D/I.  Ext: no edema or calf tenderness. SCD's on.    Results     Procedure Component Value Units Date/Time    Basic Metabolic Panel [[id number]] Collected:04/12/13 0522    Specimen Information:Blood Updated:04/12/13 0522    Magnesium [[id number]] Collected:04/12/13 0522    Specimen Information:Blood Updated:04/12/13 0522    Phosphorus [[id number]] Collected:04/12/13 0522    Specimen Information:Blood Updated:04/12/13 0522    CBC and differential [[id number]] Collected:04/12/13 0522    Specimen Information:Blood / Blood Updated:04/12/13 0522        A/P: 67 yo s/p TLH/BSO for endometrial cancer; POD#1 stable and doing well.  - Foley D/C-ed this morning. Patient due to void.  - Diet advanced to regular diet  - Patient to ambulate today.  - Dvt prophylaxis: ambulation and SCD's. Will start Lovenox pending patient's a.m. CBC.  - Dispo: Patient to be discharged today pending she is able to ambulate, void and continues to tolerate po with advancement to regular diet.    Dian Queen, PGY-2.

## 2013-04-12 NOTE — Progress Notes (Signed)
A&Ox4. Denies pain. Foley removed this am at 0500 with urine output of 600cc from 2045 last night, greenish color. SCD in place. IS encouraged to use. Incisions dry, intact. Pt has not gotten OOB. Pulse ox 93-96 % on 3L of oxygen . No acute distress overnight. Will continue to monitor patient.

## 2013-04-12 NOTE — Progress Notes (Signed)
D/C instructions given. Son at bedside. Both verbalized understanding of instructions. One rx given to pt, all other rx pt has at home. Lovenox teaching given to son. Demonstrated understanding. IV out and intact. Wheelchair requested.

## 2013-04-17 NOTE — Op Note (Signed)
Procedure Date: 04/11/2013     Patient Type: A     SURGEON: Zackery Barefoot MD  ASSISTANT:       PREOPERATIVE DIAGNOSIS:  Endometrial carcinoma.     POSTOPERATIVE DIAGNOSIS:  Endometrial carcinoma.     TITLE OF PROCEDURE:  1.  Total laparoscopic hysterectomy.  2.  Bilateral salpingo-oophorectomy.  3.  Bilateral pelvic and lower periaortic lymph node dissection.  4.  Cystoscopy.     ANESTHETIC:  General.     DRAINS:  Foley to gravity.     DESCRIPTION OF PROCEDURE:  After the risks, benefits, indications, and alternatives of the procedure  were reviewed with the patient and informed consent obtained, she was taken  to the operating room where after adequate anesthesia was obtained, she was  prepped and draped in usual sterile fashion for abdominopelvic surgery in  the low lithotomy position.  A Foley catheter was inserted, then a KOH  uterine manipulator placed, then a 5 mm periumbilical incision was made and  a 5 mm Ethicon Optiview port placed under direct visualization and a  pneumoperitoneum obtained.  Lateral 11 mm ports were then placed in the  right and left lower quadrants respectively under direct visualization,  again after the pneumoperitoneum was obtained.  A thorough review of the  abdomen and pelvis was then performed.  No clear evidence of extrauterine  pathology was appreciated.  The round ligaments were then transected  bilaterally with the Harmonic scalpel.  The anterior and posterior leaves  of the broad ligament were incised anteriorly across midline and  posteriorly and parallel to the infundibulopelvic ligaments.  The bladder  was then taken down sharply off the lower uterine segment, cervix, and  proximal vagina using EndoShears.  With the ureters under direct  visualization, the infundibulopelvic ligaments were transected bilaterally  with the Harmonic scalpel.  The uterine arteries transected bilaterally  with the Harmonic scalpel.  The cardinal and uterosacral ligaments  transected bilaterally  with the Harmonic scalpel.  The uterus, cervix,  tubes, and ovaries were now amputated along the KOH uterine manipulator and  the specimen delivered vaginally.  The vaginal cuff was then closed using  interrupted sutures of 0 Vicryl.  Frozen section noted myoinvasive disease  likely, along with larger volume intrauterine disease, and as such a  bilateral pelvic and lower periaortic lymph node dissection was performed,  removing gross lymphatic tissue from the circumflex iliac vein to the level  of the high common and lower periaortic lymph nodes.  No suspicious  adenopathy was encountered during the dissection.  With hemostasis now  assured at all dissection sites, and again no evidence of extrauterine  pathology in the abdomen or pelvis, a Carter-Thomason was used to close the  lateral 11 mm incisions, then the pneumoperitoneum evacuated, the ports  removed under direct visualization, the skin closed in a subcuticular  manner.  A cystoscopy was performed which confirmed bladder and ureteral  integrity with an indigo blue-stained solution noted from both ureteral  orifices.           D:  04/16/2013 18:56 PM by Dr. Zackery Barefoot, MD ([id number])  T:  04/17/2013 11:01 AM by [id number]      Everlean Cherry: [id number]) (Doc ID: [id number])

## 2018-10-21 IMAGING — CT CT Abdomen and Pelvis W Contrast
2 of 4 series · 15 of 46 positions shown, 17 images · IV contrast (OMNIPAQUE 350)
Comparison: October 15, 2017

CT Abdomen and Pelvis W-Contrast
INDICATION: Malignant neoplasm of endometrium.                                           
 Pertinent History: routine follow up. no symptoms to report                               
 Surgical History: Hysterectomy and oophorectomy.                                          
 Cancer: Right breast, 4177. Treated with surgery only. Endometrial, 6244.                 
 Treated with surgery, radiation and chemotherapy, 6244                                    
 GFR (past 30 days): >39 ml/min                                                            
 Metformin: None                                                                           
 Lipase: N/A       Amylase: N/A      WBC: N/A                                              
 Intravenous contrast: 100 mL Omnipaque 350                                                
 Oral contrast: Yes                                                                        
 Comments: None
TECHNIQUE: Routine with IV contrast. Helical acquisition with sagittal and                
 coronal reformations; post IV contrast images.                                            
 Utilized dose reduction techniques include: Automated Exposure Control, vendor            
 specific iterative reconstruction technique

[Series 3: axial 5mm f_0.5 · axial · 0.88mm/px · z∈[+854,+1269]mm · 12 of 97 slices shown, 14 images]
[im 7/97  soft-tissue]
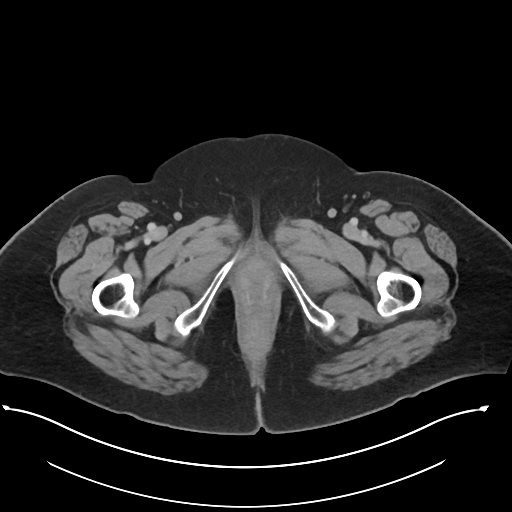
[im 7/97  bone]
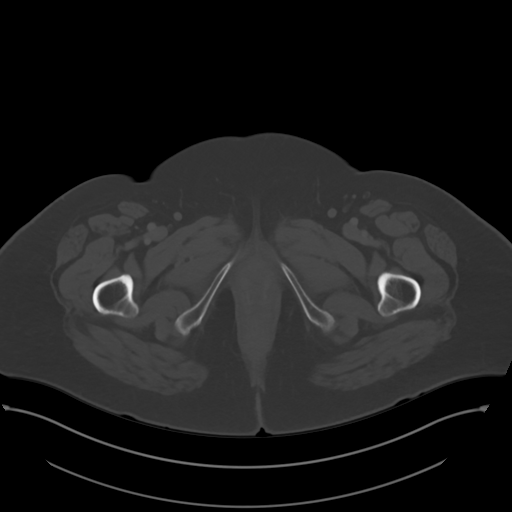
[im 13/97  soft-tissue]
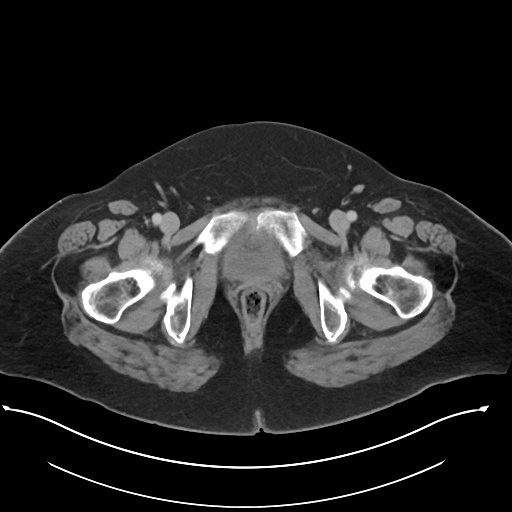
[im 20/97  soft-tissue]
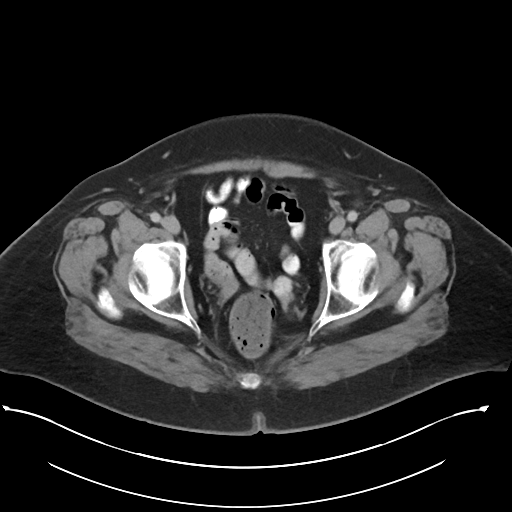
[im 33/97  soft-tissue]
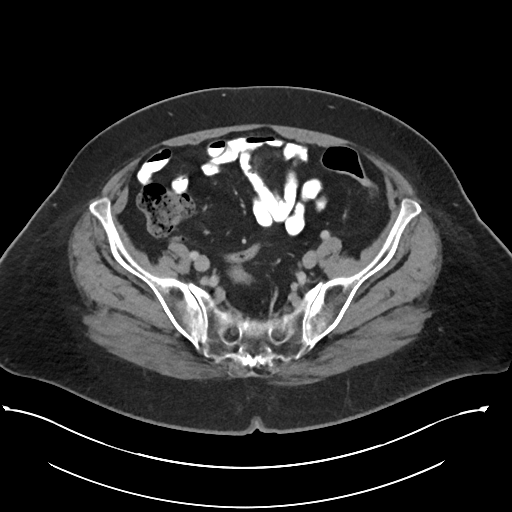
[im 39/97  soft-tissue]
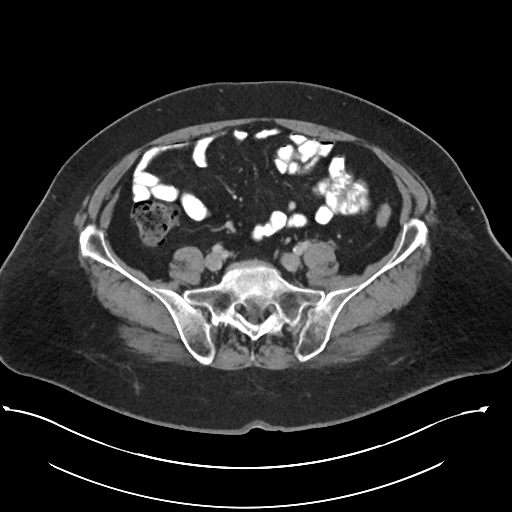
[im 45/97  soft-tissue]
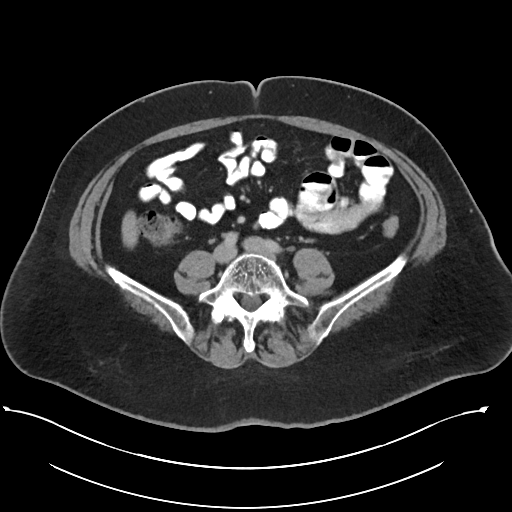
[im 52/97  soft-tissue]
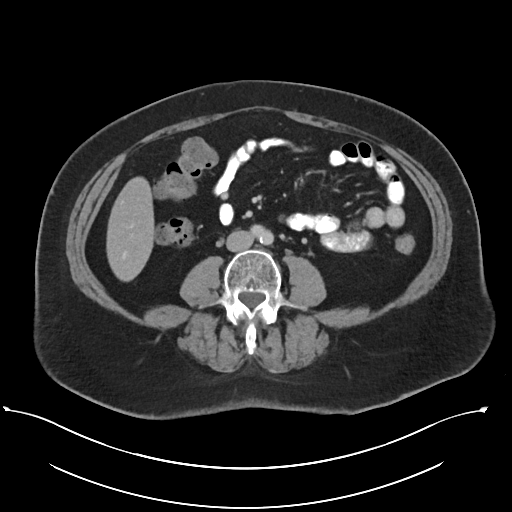
[im 58/97  soft-tissue]
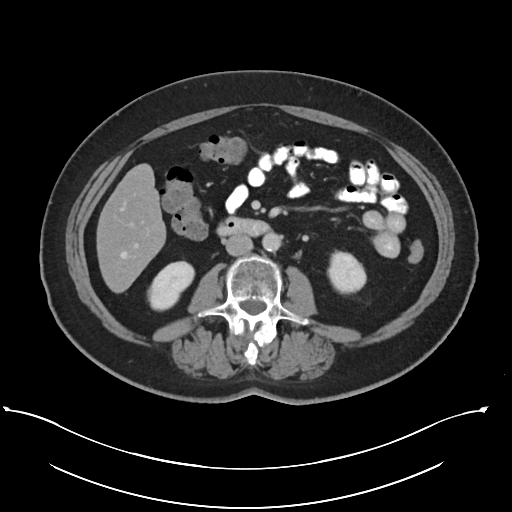
[im 65/97  soft-tissue]
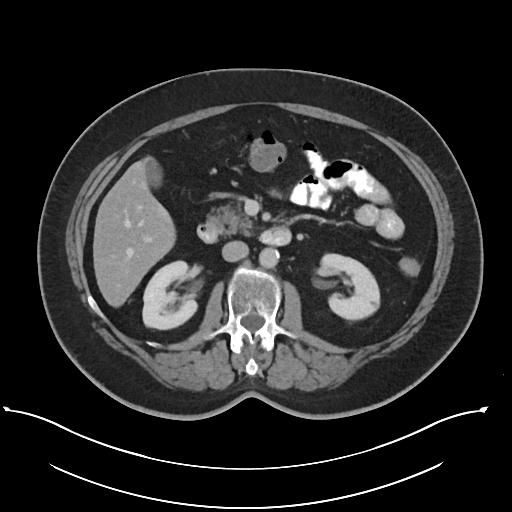
[im 65/97  bone]
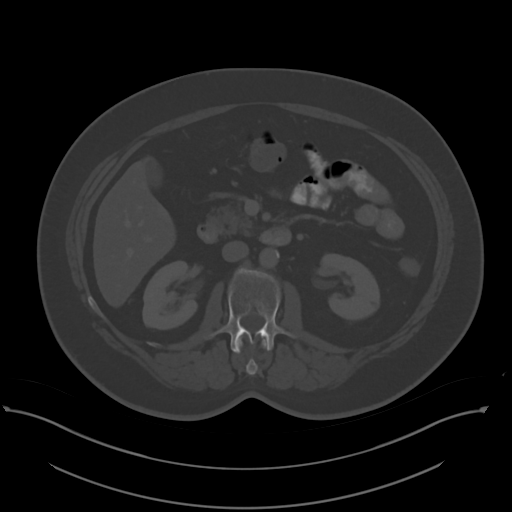
[im 77/97  soft-tissue]
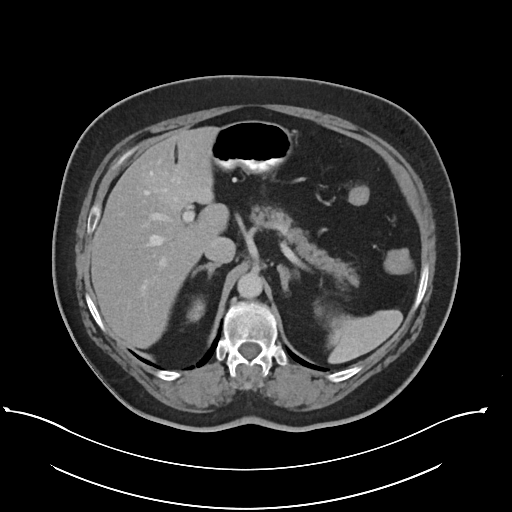
[im 84/97  soft-tissue]
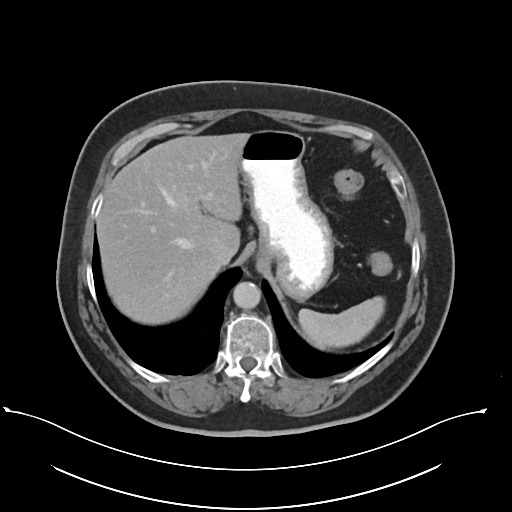
[im 90/97  soft-tissue]
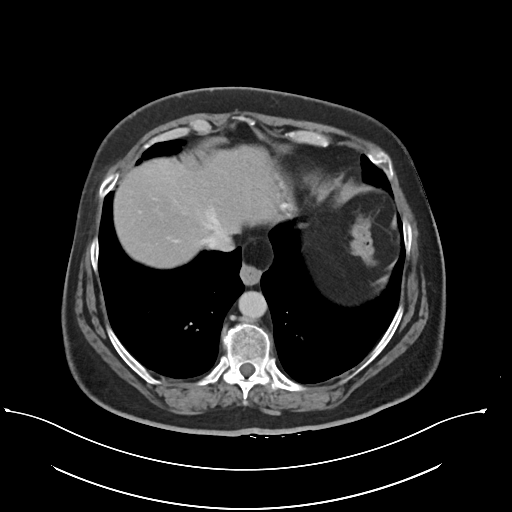

[Series 6: 2x2 cor f_0.5 · coronal · 0.95mm/px · 3 of 170 slices shown]
[im 57/170  soft-tissue]
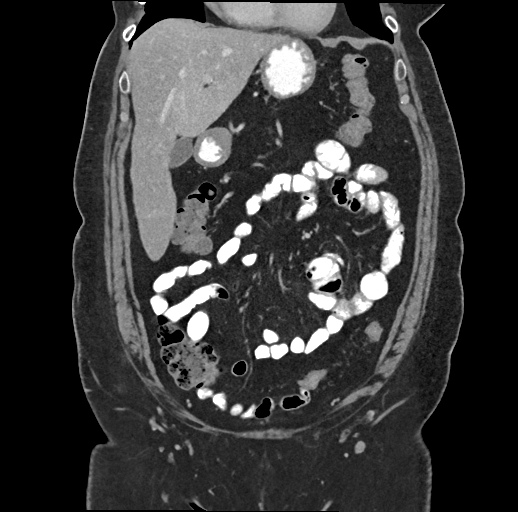
[im 76/170  soft-tissue]
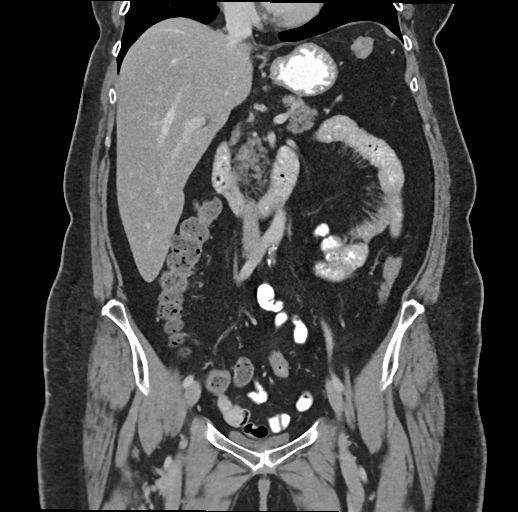
[im 94/170  soft-tissue]
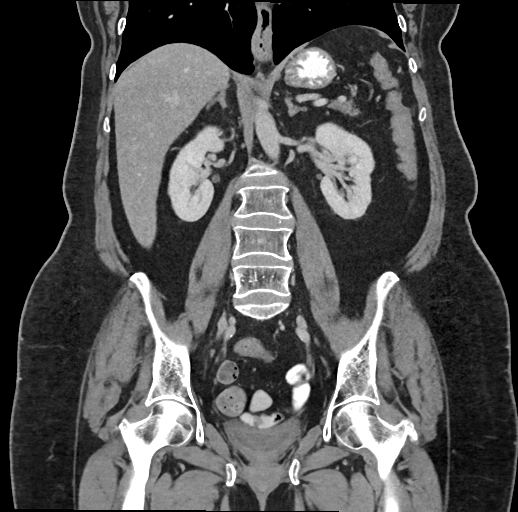

[15 of 46 positions shown; findings below may reference images not displayed]

FINDINGS: LOWER CHEST: Clear.                                                                       
 HEPATOBILIARY: Relative decreased attenuation of the liver consistent with                
 fatty                                                                                     
 infiltration. Tiny probable cyst in the medial right hepatic lobe. Nondilated             
 bile ducts. Small nodular densities in the gallbladder fundus are stable                  
 consistent with adenomyomatosis. No evidence of pericholecystic inflammation.             
 PANCREAS: Normal.                                                                         
 SPLEEN: Normal.                                                                           
 ADRENAL GLANDS: Normal.                                                                   
 URINARY TRACT: Left upper pole renal cyst measures 2.6 cm. Ureters                        
 nondilated.                                                                               
 Nondistended bladder.                                                                     
 VASCULATURE: Mild aortic/arterial atherosclerotic plaque.                                 
 LYMPH NODES AND RETROPERITONEUM: No lymphadenopathy.                                      
 PERITONEUM: No ascites or free air.                                                       
 GASTROINTESTINAL: Distal esophageal wall thickening present. Stomach is                   
 nondistended. No small bowel obstruction. Unremarkable colon.                             
 APPENDIX: Normal                                                                          
 PELVIC ORGANS: Uterus is surgically absent. No adnexal masses                             
 MUSCULOSKELETAL: Intact abdominal wall. Fairly extensive facet arthropathy                
 throughout the mid to lower lumbar spine. Slight degenerative anterolisthesis of          
 L4 on L5.
IMPRESSION: 1.  No significant abnormality.                                                           
 2.  Tiny hepatic cyst.
